# Patient Record
Sex: Female | Born: 1995 | Race: Black or African American | Hispanic: No | Marital: Single | Smoking: Never smoker
Health system: Southern US, Community
[De-identification: ages and names within clinical notes are randomized; demographics above are authoritative.]

---
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
## 2017-04-06 ENCOUNTER — Ambulatory Visit (INDEPENDENT_AMBULATORY_CARE_PROVIDER_SITE_OTHER): Payer: Medicaid Other | Admitting: Family Medicine

## 2017-04-06 ENCOUNTER — Encounter: Payer: Self-pay | Admitting: Family Medicine

## 2017-04-06 VITALS — BP 100/60 | HR 96 | Temp 98.6°F | Ht 60.0 in | Wt 106.4 lb

## 2017-04-06 DIAGNOSIS — Z0000 Encounter for general adult medical examination without abnormal findings: Secondary | ICD-10-CM

## 2017-04-06 NOTE — Patient Instructions (Signed)
It was very good to see you again today.  I'll see you back next year, or sooner if anything is going on.    Best of luck next year as well!  Health Maintenance, Female Adopting a healthy lifestyle and getting preventive care can go a long way to promote health and wellness. Talk with your health care provider about what schedule of regular examinations is right for you. This is a good chance for you to check in with your provider about disease prevention and staying healthy. In between checkups, there are plenty of things you can do on your own. Experts have done a lot of research about which lifestyle changes and preventive measures are most likely to keep you healthy. Ask your health care provider for more information. Weight and diet Eat a healthy diet  Be sure to include plenty of vegetables, fruits, low-fat dairy products, and lean protein.  Do not eat a lot of foods high in solid fats, added sugars, or salt.  Get regular exercise. This is one of the most important things you can do for your health. ? Most adults should exercise for at least 150 minutes each week. The exercise should increase your heart rate and make you sweat (moderate-intensity exercise). ? Most adults should also do strengthening exercises at least twice a week. This is in addition to the moderate-intensity exercise.  Maintain a healthy weight  Body mass index (BMI) is a measurement that can be used to identify possible weight problems. It estimates body fat based on height and weight. Your health care provider can help determine your BMI and help you achieve or maintain a healthy weight.  For females 30 years of age and older: ? A BMI below 18.5 is considered underweight. ? A BMI of 18.5 to 24.9 is normal. ? A BMI of 25 to 29.9 is considered overweight. ? A BMI of 30 and above is considered obese.  Watch levels of cholesterol and blood lipids  You should start having your blood tested for lipids and  cholesterol at 19 years of age, then have this test every 5 years.  You may need to have your cholesterol levels checked more often if: ? Your lipid or cholesterol levels are high. ? You are older than 19 years of age. ? You are at high risk for heart disease.  Cervical Cancer Your health care provider may recommend that you be screened regularly for cancer of the pelvic organs (ovaries, uterus, and vagina). This screening involves a pelvic examination, including checking for microscopic changes to the surface of your cervix (Pap test). You may be encouraged to have this screening done every 3 years, beginning at age 16.  For women ages 48-65, health care providers may recommend pelvic exams and Pap testing every 3 years, or they may recommend the Pap and pelvic exam, combined with testing for human papilloma virus (HPV), every 5 years. Some types of HPV increase your risk of cervical cancer. Testing for HPV may also be done on women of any age with unclear Pap test results.  Other health care providers may not recommend any screening for nonpregnant women who are considered low risk for pelvic cancer and who do not have symptoms. Ask your health care provider if a screening pelvic exam is right for you.  If you have had past treatment for cervical cancer or a condition that could lead to cancer, you need Pap tests and screening for cancer for at least 20 years after your  treatment. If Pap tests have been discontinued, your risk factors (such as having a new sexual partner) need to be reassessed to determine if screening should resume. Some women have medical problems that increase the chance of getting cervical cancer. In these cases, your health care provider may recommend more frequent screening and Pap tests.  Sexually transmitted infections (STIs)  You should be screened for sexually transmitted infections (STIs) including gonorrhea and chlamydia if: ? You are sexually active and are younger  than 19 years of age. ? You are older than 19 years of age and your health care provider tells you that you are at risk for this type of infection. ? Your sexual activity has changed since you were last screened and you are at an increased risk for chlamydia or gonorrhea. Ask your health care provider if you are at risk.  If you do not have HIV, but are at risk, it may be recommended that you take a prescription medicine daily to prevent HIV infection. This is called pre-exposure prophylaxis (PrEP). You are considered at risk if: ? You are sexually active and do not regularly use condoms or know the HIV status of your partner(s). ? You take drugs by injection. ? You are sexually active with a partner who has HIV.  Talk with your health care provider about whether you are at high risk of being infected with HIV. If you choose to begin PrEP, you should first be tested for HIV. You should then be tested every 3 months for as long as you are taking PrEP . Pregnancy  If you are premenopausal and you may become pregnant, ask your health care provider about preconception counseling.  If you may become pregnant, take 400 to 800 micrograms (mcg) of folic acid every day.  If you want to prevent pregnancy, talk to your health care provider about birth control (contraception).    Follow these instructions at home:  Schedule regular health, dental, and eye exams.  Stay current with your immunizations.  Do not use any tobacco products including cigarettes, chewing tobacco, or electronic cigarettes.  If you are pregnant, do not drink alcohol.  If you are breastfeeding, limit how much and how often you drink alcohol.  Limit alcohol intake to no more than 1 drink per day for nonpregnant women. One drink equals 12 ounces of beer, 5 ounces of wine, or 1 ounces of hard liquor.  Do not use street drugs.  Do not share needles.  Ask your health care provider for help if you need support or  information about quitting drugs.  Tell your health care provider if you often feel depressed.  Tell your health care provider if you have ever been abused or do not feel safe at home.

## 2017-04-06 NOTE — Progress Notes (Signed)
Subjective:    Brenda Gordon is a 21 y.o. female who presents to [facility] today for yearly physical exam:  CPE  Concerns:  None today, doing well without complaints. No recent illnesses.    Last physical last year Tetanus prior to college - do not have these records but will try to find Flu vaccine last year Eye exam:  n/a Dental exam every six months.   PMH reviewed. Patient is a nonsmoker.   No past medical history on file. No past surgical history on file.  Medications reviewed. Current Outpatient Prescriptions  Medication Sig Dispense Refill  . benzonatate (TESSALON) 100 MG capsule Take 1 capsule (100 mg total) by mouth 3 (three) times daily as needed for cough. 20 capsule 0  . oseltamivir (TAMIFLU) 75 MG capsule Take 1 capsule (75 mg total) by mouth 2 (two) times daily. 10 capsule 0   No current facility-administered medications for this visit.     Social: Smoking history:  denies Alcohol use:  denies Illicit drug use:  denies Relationship status:   single Education:  Just finished freshman year at [employer].  Did well.  Wants to do medical track -- interested in medical school  Family History:  Denies any pertinent family medical history.   Review of Systems  Constitutional: Negative for fever.  HENT: Negative for congestion, ear discharge, ear pain and hearing loss.   Eyes: Negative for blurred vision.  Respiratory: Negative for cough and wheezing.   Cardiovascular: Negative for chest pain, palpitations and leg swelling.  Gastrointestinal: Negative for nausea, vomiting and abdominal pain.  Genitourinary: Negative for dysuria, hematuria and flank pain.  Skin: Negative for rash.  Psychiatric/Behavioral: Negative for depression and suicidal ideas.   Exam: BP 100/60   Pulse 96   Temp 98.6 F (37 C) (Oral)   Ht 5' (1.524 m)   Wt 106 lb 6.4 oz (48.3 kg)   LMP 03/27/2017 (Exact Date)   SpO2 99%   BMI 20.78 kg/m  Gen:  Alert, cooperative patient who appears stated age in no  acute distress.  Vital signs reviewed. Head: [place]/AT.   Eyes:  EOMI, PERRL.   Ears:  External ears WNL, Bilateral TM's normal without retraction, redness or bulging. Nose:  Septum midline  Mouth:  MMM, tonsils non-erythematous, non-edematous.   Neck: No masses or thyromegaly or limitation in range of motion.  No cervical lymphadenopathy. Pulm:  Clear to auscultation bilaterally with good air movement.  No wheezes or rales noted.   Cardiac:  Regular rate and rhythm without murmur auscultated.  Good S1/S2. Abd:  Soft/nondistended/nontender.  Good bowel sounds throughout all four quadrants.  No masses noted.  Ext:  No clubbing/cyanosis/erythema.  No edema noted bilateral lower extremities.   Neuro:  Grossly normal, no gait abnormalities Psych:  Not depressed or anxious appearing.  Conversant and engaged  Impression/Plan: 1. Complete Physical Examination: anticipatory guidance provided.  No concerns today.  Patient doing very well.   FU in 1 year or sooner if needs anything.

## 2017-09-27 ENCOUNTER — Other Ambulatory Visit: Payer: Self-pay

## 2017-09-27 ENCOUNTER — Encounter: Payer: Self-pay | Admitting: Student

## 2017-09-27 ENCOUNTER — Ambulatory Visit (INDEPENDENT_AMBULATORY_CARE_PROVIDER_SITE_OTHER): Payer: Medicaid Other | Admitting: Student

## 2017-09-27 VITALS — BP 104/78 | HR 87 | Temp 98.6°F | Ht 60.0 in | Wt 105.0 lb

## 2017-09-27 DIAGNOSIS — R058 Other specified cough: Secondary | ICD-10-CM

## 2017-09-27 DIAGNOSIS — R05 Cough: Secondary | ICD-10-CM

## 2017-09-27 MED ORDER — AZITHROMYCIN 250 MG PO TABS: ORAL_TABLET | ORAL | 0 refills | Status: AC

## 2017-09-27 NOTE — Patient Instructions (Addendum)
It appears that you have a viral upper respiratory infection.  You could have influenza/flu.  Unfortunately, it is a kind of late to start medications. Your symptoms can last up to 2 weeks but should improve.   - Get plenty of rest and adequate hydration. - A tablespoonful of honey before bedtime is helpful for cough - Consume warm fluids (soup or tea) to provide relief for a stuffy nose and to loosen phlegm. - For nasal stuffiness, try saline nasal spray or a Neti Pot. Eating warm liquids such as chicken soup or tea may also help with nasal congestion. - For sore throat pain relief: suck on throat lozenges, hard candy or popsicles; gargle with warm salt water (1/4 tsp. salt per 8 oz. of water); and eat soft, bland foods. - Eat a well-balanced diet. If you cannot, ensure you are getting enough nutrients by taking a daily multivitamin. - Avoid dairy products, as they can thicken phlegm. - Avoid alcohol, as it impairs your body's immune system.  CONTACT YOUR DOCTOR IF YOU EXPERIENCE ANY OF THE FOLLOWING: - High fever, chest pain, shortness of breath or  not able to keep down food or fluids.  - Cough that gets worse while other cold symptoms improve - Flare up of any chronic lung problem, such as asthma - Your symptoms persist longer than 2 weeks

## 2017-09-27 NOTE — Progress Notes (Signed)
  Subjective:    Brenda Gordon is a 21 y.o. old female here for cough and fatigue  HPI  Patient reports having fatigue for a week.  She also have productive cough with greenish phlegm. She denies hemoptysis.  Reports exertional dyspnea for 2 weeks.  Denies chest pain.  Reports chills and mild headache.  Denies fever or photophobia.  Endorses myalgia.  She also reports intermittent abdominal pain.  Denies runny nose, congestion, sore throat,  diarrhea, dysuria, vaginal discharge or bleeding, leg swelling or pain.  She had a flu shot this year.  LMP 08/27/2017.  Not sexually active.  Not on birth control.  Denies recent travel.  Denies family history of blood clots. She is a Surveyor, minerals at [employer].  She says this is a stressful at school due to final exam.   Patient's last menstrual period was 08/27/2017 (approximate). Not sexually active  PMH/Problem List: does not have any active problems on file.   has no past medical history on file.  FH:  Family History  Problem Relation Age of Onset  . Cancer Neg Hx   . Heart disease Neg Hx   . Stroke Neg Hx     SH Social History   Tobacco Use  . Smoking status: Never Smoker  . Smokeless tobacco: Never Used  Substance Use Topics  . Alcohol use: Not on file  . Drug use: Not on file    Review of Systems Review of systems negative except for pertinent positives and negatives in history of present illness above.     Objective:     Vitals:   09/27/17 1427  BP: 104/78  Pulse: 87  Temp: 98.6 F (37 C)  TempSrc: Oral  SpO2: 97%  Weight: 105 lb (47.6 kg)  Height: 5' (1.524 m)   Body mass index is 20.51 kg/m.  Physical Exam GEN: appears well, no ditress EYES: PERRL, EOMI THROAT: MMM, no exudation or erythema NECK: Supple, no lymphadenopathy RESP:  No IWOB, CTAB CVS:  RRR, normal S1&S2, no murmurs GI: soft, NT with active BS MSK: No focal tenderness NEURO: Grossly intact PSYCH: normal affect    Assessment and Plan:  1. Productive  cough: Patient with influenza-like illness.  However, she has no runny nose or congestion.  She also have productive cough with greenish phlegm concerning for a typical pneumonia.  Lungs are clear to auscultation bilaterally with good aeration.  Overall, she does not seem to be in respiratory distress.  Given duration of her symptoms and greenish phlegm, will treat as a typical pneumonia with Z-Pak. PE risk score 0.  We also discussed about conservative managements including rest, adequate hydration and a tablespoonful of honey before bedtime.  - azithromycin (ZITHROMAX) 250 MG tablet; Take 2 tabs by mouth on day 1, then 1 tab by mouth daily  Dispense: 6 each; Refill: 0  Return if symptoms worsen or fail to improve.  Mercy Riding, MD 09/27/17 Pager: [phone]

## 2017-09-28 ENCOUNTER — Ambulatory Visit: Payer: Self-pay | Admitting: Student

## 2018-04-20 ENCOUNTER — Telehealth: Payer: Self-pay | Admitting: Family Medicine

## 2018-04-20 DIAGNOSIS — I491 Atrial premature depolarization: Secondary | ICD-10-CM | POA: Diagnosis not present

## 2018-04-20 NOTE — Telephone Encounter (Signed)
Pt called and to let us know she was seen at [facility], she said "I think they diagnosed me with some kind of seizure and they wanted me to follow up with my doctor" I told her we could schedule an appointment and she said she doesn't know how urgent it really is. She would call back if she thinks it becomes more serious.

## 2018-04-25 NOTE — Telephone Encounter (Signed)
Pt appt on 04/26/18. Anees Vanecek, Salome Spotted, CMA

## 2018-04-25 NOTE — Telephone Encounter (Signed)
Ok.  It would be good to have her follow up to make sure she is doing ok.

## 2018-04-26 ENCOUNTER — Ambulatory Visit (INDEPENDENT_AMBULATORY_CARE_PROVIDER_SITE_OTHER): Payer: Medicaid Other | Admitting: Family Medicine

## 2018-04-26 ENCOUNTER — Encounter: Payer: Self-pay | Admitting: Family Medicine

## 2018-04-26 ENCOUNTER — Other Ambulatory Visit: Payer: Self-pay

## 2018-04-26 VITALS — BP 100/58 | HR 64 | Temp 99.0°F | Ht 60.0 in | Wt 107.8 lb

## 2018-04-26 DIAGNOSIS — Z0000 Encounter for general adult medical examination without abnormal findings: Secondary | ICD-10-CM | POA: Diagnosis not present

## 2018-04-26 DIAGNOSIS — Z01419 Encounter for gynecological examination (general) (routine) without abnormal findings: Secondary | ICD-10-CM | POA: Diagnosis not present

## 2018-04-26 NOTE — Progress Notes (Addendum)
Brenda Gordon is a 22 y.o. female who presents to [facility] today for comprehensive physical examination:  CPE  Concerns:  No stated concerns.    - did have syncopal episode recently while at work.  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  She been standing for quite some time and became lightheaded and noted increasing heart rate and suddenly "passed out."  She awakened fever around her.  She was evaluated in the emergency department and diagnosed with a vasovagal episode.  There was question of tongue biting and some jerking movements.  However the patient denies any postictal symptoms.  She has exactly where she was when she came to is able to hold conversations for those around her.  She is able to stand up on her own and had no confusion.  She is never had any seizure disorder in the past  Last physical last year Tetanus up-to-date-due next year in 2020 Flu vaccine up-to-date Eye exam: Yearly -wears contacts Dental exam every six months.   PMH reviewed.  No past medical history on file. No past surgical history on file.  Medications reviewed. No current outpatient medications on file.   No current facility-administered medications for this visit.     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  She would like to take a "gap year" to travel next year.  Family History: No family history of cancer or epilepsy.  Review of Systems  Constitutional: Negative for fever.  HENT: Negative for congestion, ear discharge, ear pain and hearing loss.   Eyes: Negative for blurred vision.  Respiratory: Negative for cough and wheezing.   Cardiovascular: Negative for chest pain, palpitations and leg swelling.  Gastrointestinal: Negative for nausea, vomiting and abdominal pain.  Genitourinary:  Negative for dysuria, hematuria and flank pain.  Musculoskeletal: Negative for neck pain.  Skin: Negative for rash.  Neurological: Negative for dizziness and headaches.  Psychiatric/Behavioral: Negative for depression and suicidal ideas.   Exam: BP (!) 100/58   Pulse 64   Temp 99 F (37.2 C) (Oral)   Ht 5' (1.524 m)   Wt 107 lb 12.8 oz (48.9 kg)   LMP 04/06/2018 (Approximate)   SpO2 99%   BMI 21.05 kg/m  Gen:  Alert, cooperative patient who appears stated age in no acute distress.  Vital signs reviewed. Head: [place]/AT.   Eyes:  EOMI, PERRL.   Ears:  External ears WNL, Bilateral TM's normal without retraction, redness or bulging. Nose:  Septum midline  Mouth:  MMM, tonsils non-erythematous, non-edematous.   Neck: No masses or thyromegaly or limitation in range of motion.  No cervical lymphadenopathy. Pulm:  Clear to auscultation bilaterally with good air movement.  No wheezes or rales noted.   Cardiac:  Regular rate and rhythm without murmur auscultated.  Good S1/S2. Abd:  Soft/nondistended/nontender.  Good bowel sounds throughout all four quadrants.  No masses noted.  Ext:  No clubbing/cyanosis/erythema.  No edema noted bilateral lower extremities.   Neuro:  Grossly normal, no gait abnormalities.  Strength and sensation are 5 out of 5 bilateral upper and lower extremities.  No neuro deficits noted. Psych:  Not depressed or anxious appearing.  Conversant and engaged  Impression/Plan: 1. Complete Physical Examination: anticipatory guidance provided.   2.  Vaccines: She is currently up-to-date but will be due for Tdap next year  3.  Syncopal episode: -She had a prodrome and had been standing for some time.  I agree this seems to be a vasovagal incident. -Syncope can sometimes have jerking type movements that are not true seizures. -No history of seizures.  She had no postictal state.  She clearly remember the incident and had no confusion afterwards. -Follow-up if this recurs.

## 2018-04-26 NOTE — Patient Instructions (Signed)
It was good to see you again today.  Like we discussed, it sounds most likely that you passed out (vasovagal episode) and NOT a seizure.  Your tetanus isn't due until next year.  Have a good rest of the summer.  -----------------------------------------------------------  Health Maintenance, Female Adopting a healthy lifestyle and getting preventive care can go a long way to promote health and wellness. Talk with your health care provider about what schedule of regular examinations is right for you. This is a good chance for you to check in with your provider about disease prevention and staying healthy. In between checkups, there are plenty of things you can do on your own. Experts have done a lot of research about which lifestyle changes and preventive measures are most likely to keep you healthy. Ask your health care provider for more information. Weight and diet Eat a healthy diet  Be sure to include plenty of vegetables, fruits, low-fat dairy products, and lean protein.  Do not eat a lot of foods high in solid fats, added sugars, or salt.  Get regular exercise. This is one of the most important things you can do for your health. ? Most adults should exercise for at least 150 minutes each week. The exercise should increase your heart rate and make you sweat (moderate-intensity exercise). ? Most adults should also do strengthening exercises at least twice a week. This is in addition to the moderate-intensity exercise.  Maintain a healthy weight  Body mass index (BMI) is a measurement that can be used to identify possible weight problems. It estimates body fat based on height and weight. Your health care provider can help determine your BMI and help you achieve or maintain a healthy weight.  For females 72 years of age and older: ? A BMI below 18.5 is considered underweight. ? A BMI of 18.5 to 24.9 is normal. ? A BMI of 25 to 29.9 is considered overweight. ? A BMI of 30 and above is  considered obese.  Watch levels of cholesterol and blood lipids  You should start having your blood tested for lipids and cholesterol at 20 years of age, then have this test every 5 years.  You may need to have your cholesterol levels checked more often if: ? Your lipid or cholesterol levels are high. ? You are older than 20 years of age. ? You are at high risk for heart disease.

## 2018-04-27 ENCOUNTER — Encounter: Payer: Self-pay | Admitting: Family Medicine

## 2018-07-15 ENCOUNTER — Ambulatory Visit (INDEPENDENT_AMBULATORY_CARE_PROVIDER_SITE_OTHER): Payer: Medicaid Other

## 2018-07-15 DIAGNOSIS — Z23 Encounter for immunization: Secondary | ICD-10-CM | POA: Diagnosis not present

## 2018-07-15 NOTE — Progress Notes (Signed)
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 Injection given, LD. Pt tolerated injection well. [facility] updated and copy given to patient for her employer.

## 2019-04-26 ENCOUNTER — Telehealth (INDEPENDENT_AMBULATORY_CARE_PROVIDER_SITE_OTHER): Payer: BLUE CROSS/BLUE SHIELD | Admitting: Family Medicine

## 2019-04-26 ENCOUNTER — Other Ambulatory Visit: Payer: Medicaid Other

## 2019-04-26 ENCOUNTER — Other Ambulatory Visit: Payer: Self-pay

## 2019-04-26 ENCOUNTER — Telehealth: Payer: Self-pay | Admitting: *Deleted

## 2019-04-26 ENCOUNTER — Telehealth: Payer: Self-pay

## 2019-04-26 DIAGNOSIS — J029 Acute pharyngitis, unspecified: Secondary | ICD-10-CM | POA: Diagnosis not present

## 2019-04-26 DIAGNOSIS — Z20822 Contact with and (suspected) exposure to covid-19: Secondary | ICD-10-CM

## 2019-04-26 NOTE — Progress Notes (Signed)
[facility] Telemedicine Visit  Patient consented to have virtual visit. Method of visit: Video  Encounter participants: Patient: Brenda Gordon - located at home Provider: Lind Covert - located at office  Others (if applicable): no  Chief Complaint: Fatigue Sore Throat HPI: Started with fatigue 4-5 days ago.  Developed ST and muscle aches fever up to 102 last 2 days.    Taking tylenol which helps a little. No dysuria, rash, shortness of breath, cough No known exposures - No new sexual contacts On her menstrual periods know   ROS: per HPI  Pertinent PMHx: none  Exam:  Respiratory: normal Normal appearance and speech Throat - tonsils normal size with erythema and no exudate seen  Patient does not feel any swollen glands   .  Assessment/Plan:  Pharyngitis History and video exam most consistent with viral pharyngitis.  Less likely strep without exudate or adenopathy.  Patient desires covid testing. Will refer. Recommend tylenol and NSAIDs for symptom relief.   Contact us if worsening or new symptoms or prolonged symptoms     Time spent during visit with patient: 12 minutes

## 2019-04-26 NOTE — Addendum Note (Signed)
Addended by: Dimple Nanas on: 04/26/2019 02:36 PM   Modules accepted: Orders

## 2019-04-26 NOTE — Telephone Encounter (Signed)
Patient scheduled today at [facility] site at 3:15pm, informed patient of address and patient voice understanding.

## 2019-04-26 NOTE — Telephone Encounter (Signed)
This encounter was created in error - please disregard.

## 2019-04-26 NOTE — Telephone Encounter (Addendum)
Attempted to contact pt to schedule testing; left message on voicemail.   ----- Message from Lind Covert, MD sent at 04/26/2019  9:07 AM EDT ----- Regarding: Covid testing Patient needs covid 19 testing Please contact  Patient Name:  Brenda Gordon  Date of Birth:    04/24/1996 MRN:                 [id number]  Thanks  Azzie Roup MD

## 2019-04-26 NOTE — Telephone Encounter (Signed)
Attempted to call pt. To schedule for COVID testing.  Left vm to return call to [phone] to schedule appt.

## 2019-04-26 NOTE — Assessment & Plan Note (Signed)
History and video exam most consistent with viral pharyngitis.  Less likely strep without exudate or adenopathy.  Patient desires covid testing. Will refer. Recommend tylenol and NSAIDs for symptom relief.   Contact us if worsening or new symptoms or prolonged symptoms

## 2019-04-26 NOTE — Telephone Encounter (Signed)
-----   Message from Lind Covert, MD sent at 04/26/2019  9:07 AM EDT ----- Regarding: Covid testing Patient needs covid 19 testing Please contact  Patient Name:  Brenda Gordon  Date of Birth:    10/06/1996 MRN:                 [id number]  Thanks  Azzie Roup MD

## 2019-04-28 ENCOUNTER — Telehealth: Payer: Self-pay | Admitting: Family Medicine

## 2019-04-28 NOTE — Telephone Encounter (Signed)
Pt is calling because she stated that the doctor was supposed to refer her to PT. I didn't see any referrals. Also she would have to be referred to a [facility] location for this since she has the [place] and [place]  [place] [phone]

## 2019-04-30 LAB — NOVEL CORONAVIRUS, NAA: SARS-CoV-2, NAA: NOT DETECTED

## 2019-07-03 ENCOUNTER — Other Ambulatory Visit: Payer: Self-pay

## 2019-07-03 DIAGNOSIS — Z20822 Contact with and (suspected) exposure to covid-19: Secondary | ICD-10-CM

## 2019-07-04 LAB — NOVEL CORONAVIRUS, NAA: SARS-CoV-2, NAA: DETECTED — AB

## 2019-07-12 ENCOUNTER — Other Ambulatory Visit: Payer: Self-pay

## 2019-07-12 DIAGNOSIS — Z20822 Contact with and (suspected) exposure to covid-19: Secondary | ICD-10-CM

## 2019-07-14 LAB — NOVEL CORONAVIRUS, NAA: SARS-CoV-2, NAA: NOT DETECTED

## 2019-11-17 ENCOUNTER — Ambulatory Visit: Payer: Medicaid Other | Attending: Internal Medicine

## 2019-11-17 DIAGNOSIS — Z20822 Contact with and (suspected) exposure to covid-19: Secondary | ICD-10-CM

## 2019-11-18 LAB — NOVEL CORONAVIRUS, NAA: SARS-CoV-2, NAA: NOT DETECTED

## 2019-11-27 ENCOUNTER — Ambulatory Visit (HOSPITAL_COMMUNITY)
Admission: RE | Admit: 2019-11-27 | Discharge: 2019-11-27 | Disposition: A | Discharge status: Home / Self Care | Payer: Medicaid Other | Source: Ambulatory Visit | Attending: Family Medicine | Admitting: Family Medicine

## 2019-11-27 ENCOUNTER — Ambulatory Visit (INDEPENDENT_AMBULATORY_CARE_PROVIDER_SITE_OTHER): Payer: Medicaid Other | Admitting: Student in an Organized Health Care Education/Training Program

## 2019-11-27 ENCOUNTER — Other Ambulatory Visit: Payer: Self-pay

## 2019-11-27 VITALS — BP 98/65 | HR 80 | Wt 103.2 lb

## 2019-11-27 DIAGNOSIS — R55 Syncope and collapse: Secondary | ICD-10-CM

## 2019-11-27 LAB — POCT GLYCOSYLATED HEMOGLOBIN (HGB A1C): Hemoglobin A1C: 5 % (ref 4.0–5.6)

## 2019-11-27 NOTE — Patient Instructions (Signed)
It was a pleasure to see you today!  To summarize our discussion for this visit:  I am glad that you brought the symptoms to our attention today.  We are going to collect some blood work today to evaluate for common causes of syncope.  In addition to the blood work we are going to check your heart function with an EKG and orthostatic vital signs today.  Please return within a week so we can review all of these results and discuss treatment options going forward.  In the meantime, please ensure that you are drinking plenty of fluids and electrolytes as well as maintaining a good solid diet to help prevent any dehydration from contributing to your symptoms.  Some additional health maintenance measures we should update are: Health Maintenance Due  Topic Date Due  . DTAP VACCINES (1) 05/09/1998  . HIV Screening  03/09/2013  . DTaP/Tdap/Td (1 - Tdap) 03/09/2017  . PAP-Cervical Cytology Screening  03/10/2019  . PAP SMEAR-Modifier  03/10/2019  . TETANUS/TDAP  03/29/2019  . INFLUENZA VACCINE  06/03/2019  .    Please return to our clinic to see me in about a week.  Call the clinic at [phone] if your symptoms worsen or you have any concerns.   Thank you for allowing me to take part in your care,  Dr. Doristine Mango

## 2019-11-27 NOTE — Progress Notes (Signed)
Subjective:    Patient ID: Brenda Gordon, female    DOB: 1996-01-01, 24 y.o.   MRN: [id number]  CC: fainting/weight loss  HPI:  Otherwise healthy 24 year old female with no past medical diagnoses presents today for initial work-up of syncope and weight loss. Patient feels well today. Weight is 103 pounds which is down from 107 pounds a year and half ago but is not her lowest weight recorded in the past couple of years. Weight loss-patient endorses decreased appetite without postprandial issues or premature satiety. Syncope-patient has had syncopal episodes for several years but has noticed they are becoming more frequent in the last couple of months.  She does not notice any particular events that precipitate these episodes.  Her prodromal symptoms include lightheadedness, nausea, racing heart, visual field darkening with spots.  Endorses losing consciousness up to a couple of minutes and regains consciousness on the floor and takes a few seconds to regain her orientation but remembers what happened.  Denies any loss of bowel or bladder control or any shaking during these episodes.  She recovers quickly after the episodes.  Denies any over-the-counter medications for increased anxiety/panic during these episodes.  Not associated with exertion. Patient's menstrual cycles were regular every month since onset but for the past year and a half has had irregular frequency with bleeding 1-2 times per month and occasionally missing a month.  Described as heavy bleeding for 2 days followed by 2-3 normal bleeding days.  She is not sexually active. Patient works as a Presenter, broadcasting at a clinic and denies any changes in her work life or increased stresses in her life at this time.  Denies any changes in her diet or home life in conjunction with the onset of the symptoms. Family history includes brother with history of dextrocardia and multiple cardiac surgeries up until the age of 22 years old and she describes them as  living a normal life now.  Smoking status reviewed   ROS: pertinent noted in the HPI   I have personally reviewed pertinent past medical history, surgical, family, and social history as appropriate. Objective:  BP 98/65   Pulse 80   Wt 103 lb 3.2 oz (46.8 kg)   SpO2 98%   BMI 20.15 kg/m   Vitals and nursing note reviewed  General: NAD, pleasant, able to participate in exam HEENT: Thyroid without masses or tenderness to palpation Negative for cervical lymphadenopathy or axillary lymphadenopathy Cardiac: RRR, S1 S2 present. normal heart sounds, no murmurs. Respiratory: CTAB, normal effort, No wheezes, rales or rhonchi Abdomen: Scaphoid, negative for organomegaly, active bowel sounds diffusely, negative for adnexal masses, negative for tenderness diffusely in abdomen or suprapubic area.  Negative for rash. GU: Negative for CVA tenderness bilaterally Extremities: no edema or cyanosis. Skin: warm and dry, no rashes noted Neuro: alert, no obvious focal deficits Psych: Normal affect and mood  Assessment & Plan:   Syncope and collapse Multiple syncopal episodes with unclear cause at this time.  We will start with a broad differential and work-up. No personal history of cardiac disease but does have a brother with history of dextrocardia and multiple cardiac surgeries as an infant who is now healthy. -Obtain EKG to assess for WPW -Orthostatic vital signs -CBC, BMP, TSH, A1c -Provided with instructions to maintain good hydration and p.o. intake -Follow-up in 1 week to discuss results.  More thorough history  -Call patient to inform her of her lab results.  Would like to repeat her CBC and patient would  also like her vitamin D checked at her next visit.  Additionally, I would like to recheck her orthostatic vital signs and EKG as there were some abnormalities.  We have a visit already scheduled for next week to discuss further work-up.  Orders Placed This Encounter  Procedures  . CBC   . Basic Metabolic Panel  . TSH  . HgB A1c  . EKG 12-Lead  . EKG 12-Lead    Ordered by an unspecified provider     Doristine Mango, [facility] PGY-2

## 2019-11-27 NOTE — Assessment & Plan Note (Addendum)
Multiple syncopal episodes with unclear cause at this time.  We will start with a broad differential and work-up. No personal history of cardiac disease but does have a brother with history of dextrocardia and multiple cardiac surgeries as an infant who is now healthy. -Obtain EKG to assess for WPW -Orthostatic vital signs -CBC, BMP, TSH, A1c -Provided with instructions to maintain good hydration and p.o. intake -Follow-up in 1 week to discuss results.  More thorough history  -Call patient to inform her of her lab results.  Would like to repeat her CBC and patient would also like her vitamin D checked at her next visit.  Additionally, I would like to recheck her orthostatic vital signs and EKG as there were some abnormalities.  We have a visit already scheduled for next week to discuss further work-up.

## 2019-11-28 ENCOUNTER — Telehealth: Payer: Self-pay | Admitting: Student in an Organized Health Care Education/Training Program

## 2019-11-28 LAB — CBC
Hematocrit: 41.3 % (ref 34.0–46.6)
Hemoglobin: 13.7 g/dL (ref 11.1–15.9)
MCH: 29.8 pg (ref 26.6–33.0)
MCHC: 33.2 g/dL (ref 31.5–35.7)
MCV: 90 fL (ref 79–97)
Platelets: 276 10*3/uL (ref 150–450)
RBC: 4.6 x10E6/uL (ref 3.77–5.28)
RDW: 11.9 % (ref 11.7–15.4)
WBC: 3 10*3/uL — ABNORMAL LOW (ref 3.4–10.8)

## 2019-11-28 LAB — BASIC METABOLIC PANEL
BUN/Creatinine Ratio: 18 (ref 9–23)
BUN: 11 mg/dL (ref 6–20)
CO2: 21 mmol/L (ref 20–29)
Calcium: 9.4 mg/dL (ref 8.7–10.2)
Chloride: 102 mmol/L (ref 96–106)
Creatinine, Ser: 0.61 mg/dL (ref 0.57–1.00)
GFR calc Af Amer: 150 mL/min/{1.73_m2} (ref >59)
GFR calc non Af Amer: 130 mL/min/{1.73_m2} (ref >59)
Glucose: 74 mg/dL (ref 65–99)
Potassium: 4.7 mmol/L (ref 3.5–5.2)
Sodium: 137 mmol/L (ref 134–144)

## 2019-11-28 LAB — TSH: TSH: 0.908 u[IU]/mL (ref 0.450–4.500)

## 2019-11-28 NOTE — Telephone Encounter (Signed)
Informed patient of results and recommended follow up for repeat blood work and ECG.

## 2019-12-04 ENCOUNTER — Other Ambulatory Visit: Payer: Self-pay

## 2019-12-04 ENCOUNTER — Encounter: Payer: Self-pay | Admitting: Student in an Organized Health Care Education/Training Program

## 2019-12-04 ENCOUNTER — Ambulatory Visit (INDEPENDENT_AMBULATORY_CARE_PROVIDER_SITE_OTHER): Payer: Medicaid Other | Admitting: Student in an Organized Health Care Education/Training Program

## 2019-12-04 ENCOUNTER — Ambulatory Visit (HOSPITAL_COMMUNITY): Payer: Medicaid Other | Attending: Family Medicine

## 2019-12-04 ENCOUNTER — Encounter (HOSPITAL_COMMUNITY): Payer: Self-pay

## 2019-12-04 VITALS — BP 98/62 | HR 74 | Wt 104.0 lb

## 2019-12-04 DIAGNOSIS — R55 Syncope and collapse: Secondary | ICD-10-CM

## 2019-12-04 DIAGNOSIS — D72818 Other decreased white blood cell count: Secondary | ICD-10-CM | POA: Diagnosis not present

## 2019-12-04 NOTE — Progress Notes (Signed)
   Subjective:    Patient ID: Brenda Gordon, female    DOB: Nov 01, 1996, 24 y.o.   MRN: [id number]  CC: f/u syncopal episodes  HPI:  24 year old female presents for follow-up of her lab results and further work-up of syncope.  Patient denies any repeat episodes of syncope or presyncopal symptoms.  After our last visit patient was able to evaluate some habits that she has developed in the last few months while these symptoms have worsened to distinguish what may be exacerbating her symptoms.  She notices that since starting to study for the [employer] she has had increased stress with decreased sleep and decreased p.o. intake.  Since coming to this realization, patient has made sure that she is getting 3 full meals per day as well as snacking on fruits and nuts in between meals while she is at work.  She is getting at least 6 hours of sleep per night.  She feels overall well.  Denies any intentions to lose weight or any insecurities about her current weight or body habitus.  Denies any history of restricting/binging or purging habits.  I have personally reviewed pertinent past medical history, surgical, family, and social history as appropriate. Objective:  BP 98/62   Pulse 74   Wt 104 lb (47.2 kg)   SpO2 97%   BMI 20.31 kg/m   No data found.Vitals and nursing note reviewed  General: NAD, pleasant,thin,  able to participate in exam Cardiac: RRR, no murmur appreciated Respiratory: CTAB, normal WOB Extremities: no edema or cyanosis. Skin: warm and dry, no rashes noted Neuro: alert, no obvious focal deficits Psych: Normal affect and mood  EKG: normal EKG, normal sinus rhythm. Possible PACs  Assessment & Plan:  Syncope and collapse Repeated patient's labs including vitamins. Discussed with patient possibility of eating habits and sleeping habits contributing to her symptoms for which she agreed with. Offered to refer patient to nutrition clinic but she declined. Due to patient's family history of  cardiac malformations and her current symptoms offered to refer patient for an echo but she declined. Since patient is now asymptomatic since her last visit she would like no further work-up at this time. Asked patient to return if her symptoms do return or worsening and to go to the emergency department if she has any concerns in an emergent situation.  Leukopenia Patient has improved yet mild leukopenia but other cell lines are within normal limits. 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  Orders Placed This Encounter  Procedures  . CBC with Differential  . Vitamin D 1,25 dihydroxy  . Vitamin B12  . EKG 12-Lead    Doristine Mango, [facility] PGY-2

## 2019-12-04 NOTE — Patient Instructions (Signed)
It was a pleasure to see you today!  To summarize our discussion for this visit:  I'm glad to hear that you have not had any more syncopal episodes.  Your EKG and orthostatic vital signs look improved today.  We will repeat your CBC and include a couple of vitamin tests.  We discussed getting your heart evaluated with an echo but he declined at this time knowing that if your symptoms get worse you can always let us know and we can refer you at that time.  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  I know that is a very stressful time in your life.  Please make sure that you are taking care of your health by eating nutritious meals and snacking in between as well as drinking plenty of fluids to prevent further episodes of syncope.  Some additional health maintenance measures we should update are: Health Maintenance Due  Topic Date Due  . DTAP VACCINES (1) 05/09/1998  . HIV Screening  03/09/2013  . DTaP/Tdap/Td (1 - Tdap) 03/09/2017  . PAP-Cervical Cytology Screening  03/10/2019  . PAP SMEAR-Modifier  03/10/2019  . TETANUS/TDAP  03/29/2019  . INFLUENZA VACCINE  06/03/2019  .    Please return to our clinic to see your PCP to complete some of your health maintenance issues.  Call the clinic at [phone] if your symptoms worsen or you have any concerns.   Thank you for allowing me to take part in your care,  Dr. Doristine Mango   Well Child Nutrition, Young Adult This sheet provides general nutrition recommendations. Talk with a health care provider or a diet and nutrition specialist (dietitian) if you have any questions. Nutrition  The amount of food you need to eat every day depends on your age, sex, size, and activity level. To figure out your daily calorie needs, look for a calorie calculator online or talk with your health care provider. Balanced diet Eat a balanced diet. Try to include:  Fruits. Aim for 2 cups a day. Examples of 1 cup of fruit include  1 large banana, 1 small apple, 8 large strawberries, or 1 large orange. Eat a variety of whole fruits and 100% fruit juice. Choose fresh, canned, frozen, or dried forms. Choose canned fruit that has the lowest added sugar or no added sugar.  Vegetables. Aim for 2-3 cups a day. Examples of 1 cup of vegetables include 2 medium carrots, 1 large tomato, or 2 stalks of celery. Choose fresh, frozen, canned, and dried options. Eat vegetables of a variety of colors.  Low-fat dairy. Aim for 3 cups a day. Examples of 1 cup of dairy include 8 oz (230 mL) of milk, 8 oz (230 g) of yogurt, or 1 oz (44 g) of natural cheese. Choose fat-free or low-fat dairy products, including milk, yogurt, and cheese. If you are unable to tolerate dairy (lactose intolerant) or you choose not to consume dairy, you may include fortified soy beverages (soy milk).  Whole grains. Of the grain foods that you eat each day (such as pasta, rice, and tortillas), aim to include 6-8 "ounce-equivalents" of whole-grain options. Examples of 1 ounce-equivalent of whole grains include 1 cup of whole-wheat cereal,  cup of brown rice, or 1 slice of whole-wheat bread. Try to choose whole grains including brown rice, wild rice, quinoa, and oats.  Lean proteins. Aim for 5-6 "ounce-equivalents" a day. Eat a variety of protein foods, including lean meats, seafood, poultry, eggs, legumes (beans and  peas), nuts, seeds, and soy products. ? A cut of meat or fish that is the size of a deck of cards is about 3-4 ounce-equivalents. ? Foods that provide 1 ounce-equivalent of protein include 1 egg,  cup of nuts or seeds, or 1 tablespoon (16 g) of peanut butter. For more information and options for foods in a balanced diet, visit www.BuildDNA.es Tips for healthy snacking  A snack should not be the size of a full meal. Eat snacks that have 200 calories or less. Examples include: ?  whole-wheat pita with  cup hummus. ? 2 or 3 slices of deli [place]  wrapped around a cheese stick. ?  apple with 1 tablespoon of peanut butter. ? 10 baked chips with salsa.  Keep cut-up fruits and vegetables available at home and at school so they are easy to eat.  Pack healthy snacks the night before or when you pack your lunch.  Avoid pre-packaged foods. These tend to be higher in fat, sugar, and salt (sodium).  Get involved with shopping, or ask the primary food shopper in your household to get healthy snacks that you like.  Avoid chips, candy, cake, and soft drinks. Foods to avoid  Maceo Pro or heavily processed foods, such as toaster pastries and microwaveable dinners.  Drinks that contain a lot of sugar, such as sports drinks, sodas, and juice.  Foods that contain a lot of fat, sodium, or sugar. Food safety Prepare your food safely:  Wash your hands after handling raw meats.  Keep food preparation surfaces clean by washing them regularly with hot, soapy water.  Keep raw meats separate from foods that are ready-to-eat, such as fruits and vegetables.  Cook seafood, meat, poultry, and eggs to the recommended minimum safe internal temperature.  Store foods at safe temperatures. In general: ? Keep cold foods at 71F (4C) or colder. ? Keep your freezer at 49F (-18C or 18 degrees below 0C) or colder. ? Keep hot foods at 171F (60C) or warmer. ? Foods are no longer safe to eat when they have been at a temperature of 40-171F (4-60C) for more than 2 hours. Physical activity  Try to get 150 minutes of moderate-intensity physical activity each week. Examples include walking briskly or bicycling slower than 10 miles an hour (16 km an hour).  Do muscle-strengthening exercises on 2 or more days a week.  If you find it difficult to fit regular physical activity into your schedule, try: ? Taking the stairs instead of the elevator. ? Parking your car farther from the entrance or at the back of the parking lot. ? Biking or walking to work or  school.  If you need to lose weight, you may need to reduce your daily calorie intake and increase your daily amount of physical activity. Check with your health care provider before you start a new diet and exercise plan. General instructions  Do not skip meals, especially breakfast.  Water is the ideal beverage. Aim to drink six 8-oz glasses of water each day.  Avoid fad diets. These may affect your mood and growth.  If you choose to consume alcohol: ? Drink in moderation. This means two drinks a day for men and one drink a day for nonpregnant women. One drink equals 12 oz of beer, 5 oz of wine, or 1 oz of hard liquor.  You may drink coffee. It is recommended that you limit coffee intake to three to five 8-oz cups a day (up to 400 mg of caffeine).  If you are worried about your body image, talk with your parents, your health care provider, or another trusted adult like a coach or counselor. You may be at risk for developing an eating disorder. Eating disorders can lead to serious medical problems.  Food allergies may cause you to have a reaction (such as a rash, diarrhea, or vomiting) after eating or drinking. Talk with your health care provider if you have concerns about food allergies. Summary  Eat a balanced diet. Include fruits, vegetables, low-fat dairy, whole grains, and lean proteins.  Try to get 150 minutes of moderate-intensity physical activity each week, and do muscle-strengthening exercises on 2 or more days a week.  Choose healthy snacks that are 200 calories or less.  Drink plenty of water. Try to drink six 8-oz glasses a day. This information is not intended to replace advice given to you by your health care provider. Make sure you discuss any questions you have with your health care provider. Document Revised: 02/07/2019 Document Reviewed: 06/02/2017 Elsevier Patient Education  [place].

## 2019-12-05 DIAGNOSIS — H5213 Myopia, bilateral: Secondary | ICD-10-CM | POA: Diagnosis not present

## 2019-12-06 DIAGNOSIS — D72819 Decreased white blood cell count, unspecified: Secondary | ICD-10-CM | POA: Insufficient documentation

## 2019-12-06 NOTE — Assessment & Plan Note (Signed)
Patient has improved yet mild leukopenia but other cell lines are within normal limits. 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

## 2019-12-06 NOTE — Assessment & Plan Note (Signed)
Repeated patient's labs including vitamins. Discussed with patient possibility of eating habits and sleeping habits contributing to her symptoms for which she agreed with. Offered to refer patient to nutrition clinic but she declined. Due to patient's family history of cardiac malformations and her current symptoms offered to refer patient for an echo but she declined. Since patient is now asymptomatic since her last visit she would like no further work-up at this time. Asked patient to return if her symptoms do return or worsening and to go to the emergency department if she has any concerns in an emergent situation.

## 2019-12-08 LAB — CBC WITH DIFFERENTIAL/PLATELET
Basophils Absolute: 0 10*3/uL (ref 0.0–0.2)
Basos: 0 %
EOS (ABSOLUTE): 0.1 10*3/uL (ref 0.0–0.4)
Eos: 2 %
Hematocrit: 42.1 % (ref 34.0–46.6)
Hemoglobin: 13.8 g/dL (ref 11.1–15.9)
Immature Grans (Abs): 0 10*3/uL (ref 0.0–0.1)
Immature Granulocytes: 0 %
Lymphocytes Absolute: 1.5 10*3/uL (ref 0.7–3.1)
Lymphs: 43 %
MCH: 29.7 pg (ref 26.6–33.0)
MCHC: 32.8 g/dL (ref 31.5–35.7)
MCV: 91 fL (ref 79–97)
Monocytes Absolute: 0.4 10*3/uL (ref 0.1–0.9)
Monocytes: 12 %
Neutrophils Absolute: 1.4 10*3/uL (ref 1.4–7.0)
Neutrophils: 43 %
Platelets: 268 10*3/uL (ref 150–450)
RBC: 4.64 x10E6/uL (ref 3.77–5.28)
RDW: 12 % (ref 11.7–15.4)
WBC: 3.3 10*3/uL — ABNORMAL LOW (ref 3.4–10.8)

## 2019-12-08 LAB — VITAMIN D 1,25 DIHYDROXY
Vitamin D 1, 25 (OH)2 Total: 69 pg/mL — ABNORMAL HIGH
Vitamin D2 1, 25 (OH)2: <10 pg/mL
Vitamin D3 1, 25 (OH)2: 69 pg/mL

## 2019-12-08 LAB — VITAMIN B12: Vitamin B-12: 298 pg/mL (ref 232–1245)

## 2020-01-29 ENCOUNTER — Telehealth: Payer: Self-pay | Admitting: Family Medicine

## 2020-01-29 NOTE — Telephone Encounter (Signed)
Please contact her and ask her to make an appointment to see me both for a referral and to follow up on her recent syncope visit  I have openings on Wednesday AM  If she can not come on Wednesday she can make an appointment with another physicain.  If she would like to speak with me let me know  Thanks  [place]

## 2020-01-29 NOTE — Telephone Encounter (Signed)
Pt is calling and would like to have a referral to see a therapist. Please call to discuss. jw

## 2020-01-29 NOTE — Telephone Encounter (Signed)
Made appointment for patient per Dr. Erin Hearing for referral and syncope/near syncope episodes.  Ozella Almond, [place]

## 2020-01-31 ENCOUNTER — Other Ambulatory Visit: Payer: Self-pay

## 2020-01-31 ENCOUNTER — Ambulatory Visit: Payer: Medicaid Other | Admitting: Family Medicine

## 2020-01-31 ENCOUNTER — Encounter: Payer: Self-pay | Admitting: Family Medicine

## 2020-01-31 DIAGNOSIS — F39 Unspecified mood [affective] disorder: Secondary | ICD-10-CM | POA: Diagnosis not present

## 2020-01-31 NOTE — Patient Instructions (Addendum)
Good to see you today!  Thanks for coming in.  Consider regular exercise as a way to keep your body and mind healthy  If you are feeling worse or having trouble setting up a therapist please call or My Chart me   Please send me a My Chart Message in 2 weeks letting me know how you are    Therapy and Counseling Resources Most providers on this list will take Medicaid. [facility]  [address] [place], [place] [address]      [phone]  [place] [address] [place], [place] [address]       [place] [address], [place], [place]    [facility] Bl[facility] [address], [place], [place]  [facility]:  [place]. [place]ride  [facility]:  [place] [address]oni Muse, [place]  [facility] (under & uninsured) [address], [place] [phone]    [email or web address] [place]     Phone:  [phone]     [place]  [place] [address]      [place], [address] [place]: [place], [place], [place]   [place] ([place] therapist) [address] [place] [address]   [place] [address]    [phone]    [facility]   [place],  [place], [place]   [place]  [place]  [facility]  [address] [place], [place]        [phone]  [facility]Walk In Clinic under & uninsured [place], To schedule an appointment call [phone]- [phone] [address], [place] [phone]):  [place] - Fri from 8 AM - 3 PM  [facility] [place],   ([place])   [place], [place]: [phone]) 8:30 - 12; 1 - 2:30  [facility] [place],  [place], [place]    [phone]):8:30 - 12; 2 - 3PM  [facility],  [address],  [place]; [phone]):   Mon - Fri 8 AM - 5 PM  Specific Provider options Psychology Today  [email or web address] 1. click on find a therapist  2. enter your zip code 3. left side and select or tailor a therapist for your specific need.   [facility] Provider Directory [email or web address]  (Medicaid)   Follow all drop down to find a provider  [place] [phone] or [email or web address], [place], [place] Recovery support and educational   24- Hour Availability:  . [place] or [phone]  . [facility] [employer] [phone]

## 2020-01-31 NOTE — Progress Notes (Signed)
    SUBJECTIVE:   CHIEF COMPLAINT / HPI:   ANXIETY DEPRESSION Has been having trouble concentrating with frequent worrying thoughts.  Downplays any depressive feelings but indicates they are very significant on PHQ 9.  No suicidal ideation.   Feels these are interfering with her ability to study for the MCAT and with her relations with her family.   No prior treatment for mood disorder Brother has bipolar No substance use Is not exercising but finds it centering   SYNCOPE Seen 2/1 for syncope episode.  None since.  Mild lightheadness when stands quickly. No chest pain   Her lab work up was unremarkable. She did not want further work up   [place] / [place]: Graduated college in December 2020  OBJECTIVE:   BP (!) 88/62   Pulse 76   Ht 5' (1.524 m)   Wt 103 lb 9.6 oz (47 kg)   SpO2 98%   BMI 20.23 kg/m   Psych:  Cognition and judgment appear intact. Alert, communicative  and cooperative with normal attention span and concentration. No apparent delusions, illusions, hallucinations   ASSESSMENT/PLAN:   Mood disorder ([place]) Her interview suggests more anxiety while PHQ 9 indicates depression.  When we discussed the discrepancy she felt was more anxiety. She does not wish to start medication now See after visit summary. She will stay in touch with My Chart    I spent 35 minutes reviewing her chart and interviewing and counseling and documenting   Lind Covert, [place]

## 2020-01-31 NOTE — Assessment & Plan Note (Signed)
Her interview suggests more anxiety while PHQ 9 indicates depression.  When we discussed the discrepancy she felt was more anxiety. She does not wish to start medication now See after visit summary. She will stay in touch with My Chart

## 2020-02-16 ENCOUNTER — Encounter: Payer: Self-pay | Admitting: Family Medicine

## 2020-06-21 ENCOUNTER — Encounter: Payer: Self-pay | Admitting: Family Medicine

## 2020-10-18 ENCOUNTER — Ambulatory Visit: Payer: Medicaid Other

## 2020-11-08 DIAGNOSIS — Z20822 Contact with and (suspected) exposure to covid-19: Secondary | ICD-10-CM | POA: Diagnosis not present

## 2021-01-14 ENCOUNTER — Encounter: Payer: Medicaid Other | Admitting: Family Medicine

## 2021-01-30 ENCOUNTER — Ambulatory Visit: Payer: Medicaid Other | Attending: Internal Medicine

## 2021-01-30 DIAGNOSIS — Z23 Encounter for immunization: Secondary | ICD-10-CM

## 2021-01-30 NOTE — Progress Notes (Signed)
   Covid-19 Vaccination Clinic  Name:  Brenda Gordon    MRN: [id number] DOB: 03/02/1996  01/30/2021  [name] was observed post Covid-19 immunization for 15 minutes without incident. She was provided with Vaccine Information Sheet and instruction to access the V-Safe system.   [name] was instructed to call 911 with any severe reactions post vaccine: Marland Kitchen Difficulty breathing  . Swelling of face and throat  . A fast heartbeat  . A bad rash all over body  . Dizziness and weakness   Immunizations Administered    Name Date Dose VIS Date Route   PFIZER Comrnaty(Gray TOP) Covid-19 Vaccine 01/30/2021  3:37 PM 0.3 mL 10/10/2020 Intramuscular   Manufacturer: [employer]   Lot: [id number]   [place]: [phone]

## 2021-02-06 ENCOUNTER — Other Ambulatory Visit (HOSPITAL_BASED_OUTPATIENT_CLINIC_OR_DEPARTMENT_OTHER): Payer: Self-pay

## 2021-02-10 ENCOUNTER — Other Ambulatory Visit (HOSPITAL_BASED_OUTPATIENT_CLINIC_OR_DEPARTMENT_OTHER): Payer: Self-pay

## 2021-02-10 MED ORDER — PFIZER-BIONT COVID-19 VAC-TRIS 30 MCG/0.3ML IM SUSP
INTRAMUSCULAR | 0 refills | Status: DC
  Filled 2021-02-10: qty 0.3, 1d supply, fill #0

## 2021-02-18 ENCOUNTER — Encounter: Payer: Medicaid Other | Admitting: Family Medicine

## 2021-03-12 ENCOUNTER — Ambulatory Visit (INDEPENDENT_AMBULATORY_CARE_PROVIDER_SITE_OTHER): Payer: Medicaid Other | Admitting: Family Medicine

## 2021-03-12 ENCOUNTER — Other Ambulatory Visit: Payer: Self-pay

## 2021-03-12 ENCOUNTER — Encounter: Payer: Self-pay | Admitting: Family Medicine

## 2021-03-12 DIAGNOSIS — F39 Unspecified mood [affective] disorder: Secondary | ICD-10-CM | POA: Diagnosis not present

## 2021-03-12 DIAGNOSIS — R5383 Other fatigue: Secondary | ICD-10-CM | POA: Diagnosis not present

## 2021-03-12 DIAGNOSIS — G4452 New daily persistent headache (NDPH): Secondary | ICD-10-CM | POA: Diagnosis not present

## 2021-03-12 NOTE — Progress Notes (Signed)
    SUBJECTIVE:   CHIEF COMPLAINT / HPI:   HEADACHE Headache started in March.  They come and go but are almost daily now.  Almost all are L sided.  No prodrome or nausea and vomiting or photophobia or weakness or visual changes.  Improve with tylenol  Head trauma: no Sudden onset: no Previous similar headaches: no Taking blood thinners: no History of cancer: no  Symptoms Nose congestion stuffiness:  no Nausea vomiting: no Photophobia: no Noise sensitivity: no Double vision or loss of vision: no Fever: no Neck Stiffness: no Trouble walking or speaking: no  Has not seen her eye doctor for a while   FATIGUE Mood Overall feels better than last visit.  Does feel very tired most of the time and does have trouble sleeping.  No weight loss or fever.   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  Never been sexually active  OBJECTIVE:   BP 111/73   Pulse 75   Wt 107 lb 9.6 oz (48.8 kg)   LMP 03/02/2021   SpO2 100%   BMI 21.01 kg/m   Neurologic exam : Cn 2-7 intact Strength equal & normal in upper & lower extremities Able to walk on heels and toes.   Balance normal  Romberg normal, finger to nose normal Eye - Pupils Equal Round Reactive to light, Extraocular movements intact, Fundi without hemorrhage or visible lesions, Conjunctiva without redness or discharge The exam light did start to bring on a headache  No temporal tenderness Neck:  No deformities, thyromegaly, masses, or tenderness noted.   Supple with full range of motion without pain.    ASSESSMENT/PLAN:   Fatigue Likely related to stress but will check labs   Mood disorder (HCC) No depressive but does have fatigue and sleep issues and feels she is stressed due to pending medical school applications.  Consider counseling and regular  exercise   Headache Seems consistent with tension headache except for unilaterality which suggest migraine.  No other migraine type symptoms.  No signs of intracranial lesion of tumor or cva or encephalitis.   Keep headache diary and address stress with close follow up      Lind Covert, [place]

## 2021-03-12 NOTE — Patient Instructions (Signed)
Good to see you today - Thank you for coming in  Things we discussed today:  Headache - Keep a diary of when starts, any other symptoms, when stops and if you did anything  See your eye doctor   I will call you if your tests are not good.  Otherwise, I will send you a message on MyChart (if it is active) or a letter in the mail..  If you do not hear from me with in 2 weeks please call our office.     Consider stress reduction - consider worry time and counselor   Come back to see me in 3 weeks

## 2021-03-13 DIAGNOSIS — R519 Headache, unspecified: Secondary | ICD-10-CM | POA: Insufficient documentation

## 2021-03-13 LAB — CMP14+EGFR
ALT: 15 IU/L (ref 0–32)
AST: 18 IU/L (ref 0–40)
Albumin/Globulin Ratio: 1.4 (ref 1.2–2.2)
Albumin: 4.4 g/dL (ref 3.9–5.0)
Alkaline Phosphatase: 67 IU/L (ref 44–121)
BUN/Creatinine Ratio: 23 (ref 9–23)
BUN: 15 mg/dL (ref 6–20)
Bilirubin Total: 0.3 mg/dL (ref 0.0–1.2)
CO2: 19 mmol/L — ABNORMAL LOW (ref 20–29)
Calcium: 9.2 mg/dL (ref 8.7–10.2)
Chloride: 101 mmol/L (ref 96–106)
Creatinine, Ser: 0.66 mg/dL (ref 0.57–1.00)
Globulin, Total: 3.1 g/dL (ref 1.5–4.5)
Glucose: 77 mg/dL (ref 65–99)
Potassium: 4.2 mmol/L (ref 3.5–5.2)
Sodium: 139 mmol/L (ref 134–144)
Total Protein: 7.5 g/dL (ref 6.0–8.5)
eGFR: 126 mL/min/{1.73_m2} (ref >59)

## 2021-03-13 LAB — CBC
Hematocrit: 43.1 % (ref 34.0–46.6)
Hemoglobin: 14.4 g/dL (ref 11.1–15.9)
MCH: 30.2 pg (ref 26.6–33.0)
MCHC: 33.4 g/dL (ref 31.5–35.7)
MCV: 90 fL (ref 79–97)
Platelets: 267 10*3/uL (ref 150–450)
RBC: 4.77 x10E6/uL (ref 3.77–5.28)
RDW: 12 % (ref 11.7–15.4)
WBC: 2.9 10*3/uL — ABNORMAL LOW (ref 3.4–10.8)

## 2021-03-13 NOTE — Assessment & Plan Note (Signed)
Seems consistent with tension headache except for unilaterality which suggest migraine.  No other migraine type symptoms.  No signs of intracranial lesion of tumor or cva or encephalitis.   Keep headache diary and address stress with close follow up

## 2021-03-13 NOTE — Assessment & Plan Note (Signed)
Likely related to stress but will check labs

## 2021-03-13 NOTE — Assessment & Plan Note (Signed)
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  Consider counseling and regular exercise

## 2021-03-24 ENCOUNTER — Ambulatory Visit (INDEPENDENT_AMBULATORY_CARE_PROVIDER_SITE_OTHER): Payer: Medicaid Other | Admitting: Family Medicine

## 2021-03-24 ENCOUNTER — Other Ambulatory Visit: Payer: Self-pay

## 2021-03-24 VITALS — BP 110/62 | HR 69

## 2021-03-24 DIAGNOSIS — T7840XS Allergy, unspecified, sequela: Secondary | ICD-10-CM | POA: Diagnosis not present

## 2021-03-24 DIAGNOSIS — B349 Viral infection, unspecified: Secondary | ICD-10-CM | POA: Insufficient documentation

## 2021-03-24 DIAGNOSIS — M791 Myalgia, unspecified site: Secondary | ICD-10-CM | POA: Diagnosis not present

## 2021-03-24 MED ORDER — CETIRIZINE HCL 10 MG PO TABS: 10.0000 mg | ORAL_TABLET | Freq: Every day | ORAL | 11 refills | Status: DC

## 2021-03-24 NOTE — Patient Instructions (Addendum)
Try zyrtec daily.  Drink plenty of water.  Can try tea with honey, cough drops, OTC cough medications as needed.  Warm showers can help as well.  F.u if not improving by the end of this week or sooner if any difficulty breathing, severe pain, blurred vision, weakness/numbness, or problems with speech.

## 2021-03-24 NOTE — Progress Notes (Signed)
    SUBJECTIVE:   CHIEF COMPLAINT / HPI: "fatigue/foggy"  Brenda Gordon is a 25 year old female presenting for evaluation of increased fatigue, myalgias, foggy headed, and headache.  Symptoms initially started on Saturday, she had just started her menstrual cycle so attributed the fatigue to that.  However she then continued to develop myalgias and feeling foggy headed with some congestion and scratchy throat (not bothering her much), prompting her that may be something else was going on.  Headache was like her usual for the past 2-3 months, using Tylenol and ibuprofen with some relief but not as much as usual.  No known sick contacts, had her [place] booster in 12/2020.  Stayed home from work today.  Denies any associated fever, difficulty breathing, rash, cough, vomiting, diarrhea, blurred vision, weakness, or numbness.  She is curious if maybe this is being caused by allergies as she had a similar scenario about 2 months ago.  The most bothersome to her right now is the fatigue.  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  PERTINENT  PMH / PSH: Headaches, fatigue  OBJECTIVE:   BP 110/62   Pulse 69   LMP 03/02/2021   SpO2 99%   General: Alert, NAD, well-appearing HEENT: NCAT, MMM, oropharynx nonerythematous, bilateral TMs clear with appropriate light reflex, normal external canals bilaterally.  EOMI, PERRLA. Cardiac: RRR no m/g/r Lungs: Clear bilaterally, no increased WOB, no wheezing or decreased breath sounds Abdomen: soft, non-tender Msk: Moves all extremities spontaneously, normal gait  Neuro: Alert and oriented.  CN II-XII intact.  Able to move all extremities spontaneously and equally.  Follows normal conversation, can follow complex commands.  Sensation to light touch intact. Ext: Warm, dry, 2+ distal pulses  ASSESSMENT/PLAN:   Viral illness Overall syndrome with increased fatigue/mental fogginess/myalgias/sore throat most consistent with viral illness.  She is  well-appearing and neurologically intact on exam.  Swabbed for COVID today as well, however fortunately just had her booster 2 months ago.  Will follow-up COVID results.  Encouraged adequate hydration, Tylenol/ibuprofen, tea with honey, OTC medications as needed.  Rx'd Zyrtec in case additional allergic component, however would not expect this to cause fatigue/myalgias.    ED precautions discussed, follow-up if not improving by the end of the week.  Work note provided.  Patriciaann Clan, [place]

## 2021-03-24 NOTE — Assessment & Plan Note (Signed)
Overall syndrome with increased fatigue/mental fogginess/myalgias/sore throat most consistent with viral illness.  She is well-appearing and neurologically intact on exam.  Swabbed for COVID today as well, however fortunately just had her booster 2 months ago.  Will follow-up COVID results.  Encouraged adequate hydration, Tylenol/ibuprofen, tea with honey, OTC medications as needed.  Rx'd Zyrtec in case additional allergic component, however would not expect this to cause fatigue/myalgias.

## 2021-03-25 LAB — NOVEL CORONAVIRUS, NAA: SARS-CoV-2, NAA: NOT DETECTED

## 2021-03-25 LAB — SARS-COV-2, NAA 2 DAY TAT

## 2021-12-17 ENCOUNTER — Ambulatory Visit: Payer: Medicaid Other | Admitting: Family Medicine

## 2021-12-17 ENCOUNTER — Encounter: Payer: Self-pay | Admitting: Family Medicine

## 2021-12-17 ENCOUNTER — Other Ambulatory Visit: Payer: Self-pay

## 2021-12-17 VITALS — BP 99/60 | HR 71 | Wt 115.0 lb

## 2021-12-17 DIAGNOSIS — Z114 Encounter for screening for human immunodeficiency virus [HIV]: Secondary | ICD-10-CM | POA: Diagnosis not present

## 2021-12-17 DIAGNOSIS — N9489 Other specified conditions associated with female genital organs and menstrual cycle: Secondary | ICD-10-CM

## 2021-12-17 DIAGNOSIS — Z1159 Encounter for screening for other viral diseases: Secondary | ICD-10-CM

## 2021-12-17 MED ORDER — NORGESTIMATE-ETH ESTRADIOL 0.25-35 MG-MCG PO TABS: 1.0000 | ORAL_TABLET | Freq: Every day | ORAL | 1 refills | Status: DC

## 2021-12-17 NOTE — Patient Instructions (Addendum)
Good to see you today - Thank you for coming in  Things we discussed today:  Menstrual periods Start the bcps the first day of your next menstrual period and then take continuously skipping the placebo pills  Let me know about headache or leg swelling and stay active on the flight and consider support stockings  Let me know with any questions or problems

## 2021-12-17 NOTE — Progress Notes (Signed)
° ° °  SUBJECTIVE:   CHIEF COMPLAINT / HPI:   Period Regulation Wishes to pilgrimage to [place] and would like not to have menstrual period while there. She has researched and would like to start oral bcp.  No history of blood clots or migraine headaches   PERTINENT  PMH / PSH: working for [employer] care.  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  She will let me know if any problems      Lind Covert, [place]

## 2021-12-17 NOTE — Assessment & Plan Note (Signed)
Decided to start BCP with next menstrual periods and continue without placebo until back from pilgrimage.  She will let me know if any problems

## 2022-01-09 ENCOUNTER — Telehealth: Payer: Self-pay | Admitting: Family Medicine

## 2022-01-09 NOTE — Telephone Encounter (Signed)
Created in error

## 2022-01-09 NOTE — Telephone Encounter (Deleted)
-----   Message from Lind Covert, MD sent at 12/17/2021  3:06 PM EST ----- ?Regarding: CASE - MP suppression ? ? ?

## 2022-02-04 ENCOUNTER — Ambulatory Visit: Payer: Medicaid Other | Admitting: Family Medicine

## 2022-04-07 ENCOUNTER — Encounter: Payer: Self-pay | Admitting: *Deleted

## 2022-05-29 ENCOUNTER — Ambulatory Visit: Payer: Medicaid Other | Admitting: Family Medicine

## 2022-05-29 ENCOUNTER — Ambulatory Visit: Payer: Medicaid Other

## 2022-05-29 VITALS — BP 93/67 | HR 65 | Ht 60.0 in | Wt 111.6 lb

## 2022-05-29 DIAGNOSIS — R200 Anesthesia of skin: Secondary | ICD-10-CM

## 2022-05-29 DIAGNOSIS — L84 Corns and callosities: Secondary | ICD-10-CM | POA: Diagnosis not present

## 2022-05-29 NOTE — Progress Notes (Signed)
    SUBJECTIVE:   CHIEF COMPLAINT / HPI:  Chief Complaint  Patient presents with   Toe Pain   Numbness    Patient presents with right great toe numbness that she first noticed about 4 days ago.  Numbness is only on the lateral aspect of the toe.  She recently returned from a trip to [place] and was doing a significant amount of walking.  PERTINENT  PMH / PSH: Noncontributory  Patient Care Team: Lind Covert, MD as PCP - General (Family Medicine)   OBJECTIVE:   BP 93/67   Pulse 65   Ht 5' (1.524 m)   Wt 111 lb 9.6 oz (50.6 kg)   LMP 05/20/2022   SpO2 100%   BMI 21.80 kg/m   Physical Exam Constitutional:      General: She is not in acute distress. Cardiovascular:     Rate and Rhythm: Normal rate.  Pulmonary:     Effort: Pulmonary effort is normal. No respiratory distress.  Musculoskeletal:     Cervical back: Neck supple.  Skin:    Comments: Callus noted on the medial aspect of the right great toe.  There is minor inward curving of the toenail of the great toes without obvious erythema or overt ingrown toenail.  Neurological:     Mental Status: She is alert.     Comments: Sensation is decreased with monofilament testing at the location of the callus.         05/29/2022    3:41 PM  Depression screen PHQ 2/9  Decreased Interest 0  Down, Depressed, Hopeless 0  PHQ - 2 Score 0  Altered sleeping 0  Tired, decreased energy 0  Change in appetite 0  Feeling bad or failure about yourself  0  Trouble concentrating 0  Moving slowly or fidgety/restless 0  Suicidal thoughts 0  PHQ-9 Score 0  Difficult doing work/chores Not difficult at all     {Show previous vital signs (optional):23777}    ASSESSMENT/PLAN:   Numbness of right great toe I think this is most likely related to a callus on her foot related to significant amount of walking she has done recently which I think will slowly improve on its own.  Inward curving of the toenails could possibly be  contributing as well.  Reasonable to check lab work. - labs: vitamin B12, TSH, A1c - epsom salt baths  Return if symptoms worsen or fail to improve.   Zola Button, MD [place]

## 2022-05-29 NOTE — Patient Instructions (Addendum)
It was nice seeing you today!  Blood work today.  You can try epsom salt baths.  If getting worse or not going away, you can schedule an appointment with me for toenail removal.  Stay well, Zola Button, MD [place] [phone]  --  Make sure to check out at the front desk before you leave today.  Please arrive at least 15 minutes prior to your scheduled appointments.  If you had blood work today, I will send you a MyChart message or a letter if results are normal. Otherwise, I will give you a call.  If you had a referral placed, they will call you to set up an appointment. Please give [place] a call if you don't hear back in the next 2 weeks.  If you need additional refills before your next appointment, please call your pharmacy first.

## 2022-05-29 NOTE — Progress Notes (Deleted)
  SUBJECTIVE:   CHIEF COMPLAINT / HPI:   Right Foot/Toe Pain:  Onset: *** Trauma to area: *** Discoloration: *** Sensation: *** Quality: ***   PERTINENT  PMH / PSH: None   OBJECTIVE:  There were no vitals taken for this visit.  General: NAD, pleasant, able to participate in exam Cardiac: RRR, no murmurs auscultated Respiratory: CTAB, normal WOB Abdomen: soft, non-tender, non-distended, normoactive bowel sounds Extremities: warm and well perfused, no edema or cyanosis  Right Foot:  Skin: warm and dry, no rashes noted Neuro: alert, no obvious focal deficits, speech normal Psych: Normal affect and mood  ASSESSMENT/PLAN:  No problem-specific Assessment & Plan notes found for this encounter.   No orders of the defined types were placed in this encounter.  No orders of the defined types were placed in this encounter.   - Tylenol and Ibuprofen alternating for pain - Voltaren gel as needed  - Return if symptoms persists after conservative treatment in 1-2 months    No follow-ups on file. Erskine Emery, MD 05/29/2022, 7:39 AM PGY-2, [place] {    This will disappear when note is signed, click to select method of visit    :1}

## 2022-05-30 LAB — TSH RFX ON ABNORMAL TO FREE T4: TSH: 0.823 u[IU]/mL (ref 0.450–4.500)

## 2022-05-30 LAB — VITAMIN B12: Vitamin B-12: 576 pg/mL (ref 232–1245)

## 2022-05-30 LAB — HEMOGLOBIN A1C
Est. average glucose Bld gHb Est-mCnc: 103 mg/dL
Hgb A1c MFr Bld: 5.2 % (ref 4.8–5.6)

## 2022-07-13 NOTE — Progress Notes (Deleted)
    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  ***  ASSESSMENT/PLAN:   No problem-specific Assessment & Plan notes found for this encounter.     Lind Covert, MD [place]

## 2022-07-14 ENCOUNTER — Ambulatory Visit: Payer: Medicaid Other | Admitting: Family Medicine

## 2022-08-12 ENCOUNTER — Telehealth: Payer: Self-pay | Admitting: *Deleted

## 2022-08-12 NOTE — Telephone Encounter (Signed)
Patient states that she has an irregular place on her hand that just came up about a month ago.  There is no irritation of discomfort but would like to get it checked out. She is scheduled for skin clinic on 08/19/22.  Denys Labree,CMA

## 2022-08-19 ENCOUNTER — Ambulatory Visit: Payer: Medicaid Other | Admitting: Student

## 2022-08-19 VITALS — BP 95/65 | HR 67 | Temp 98.3°F | Ht 60.0 in | Wt 118.6 lb

## 2022-08-19 DIAGNOSIS — D229 Melanocytic nevi, unspecified: Secondary | ICD-10-CM | POA: Diagnosis not present

## 2022-08-19 NOTE — Patient Instructions (Signed)
It was great to see you! Thank you for allowing me to participate in your care!  You have a nevus or mole on your thumb. This is non-concerning and shouldn't change or bother you.  *If mole starts to change/bother you/grow/change colors or shape, please come back in to be seen, as this may be a sign of skin cancer.    Take care and seek immediate care sooner if you develop any concerns.   Dr. Holley Bouche, MD [place]

## 2022-08-19 NOTE — Progress Notes (Signed)
  SUBJECTIVE:   CHIEF COMPLAINT / HPI:   Spot on right hand that has been there for 3 months. It just showed up one day, she doesn't think it's gotten bigger. And never bothers her or itches. Only on right thumb.   PERTINENT  PMH / PSH:     OBJECTIVE:  BP 95/65   Pulse 67   Temp 98.3 F (36.8 C)   Ht 5' (1.524 m)   Wt 118 lb 9.6 oz (53.8 kg)   LMP 08/05/2022 (Approximate)   SpO2 100%   BMI 23.16 kg/m  Skin: Small pinpoint well demarcated hyperpigmented macule with regular borders  ASSESSMENT/PLAN:  Nevus  Patient comes in with concern for lesion on thumb that's been there for 3 months. Lesion is hyperpigmented pinpoint macule with symmetry, regular boarder, consistent color, and a non-concerning size without further development or evolution. This lesion is likely a nevus, but also considered melanoma. Given characteristics of lesion, low concern for melanoma. Gave patient return instructions if lesion begins to evolve.       No follow-ups on file. Holley Bouche, MD 08/19/2022, 9:34 AM PGY-2, [place]

## 2022-12-24 ENCOUNTER — Encounter: Payer: Self-pay | Admitting: Family Medicine

## 2022-12-24 MED ORDER — NORGESTIMATE-ETH ESTRADIOL 0.25-35 MG-MCG PO TABS: 1.0000 | ORAL_TABLET | Freq: Every day | ORAL | 1 refills | Status: DC

## 2023-10-19 ENCOUNTER — Encounter: Payer: Medicaid Other | Admitting: Family Medicine

## 2023-11-05 ENCOUNTER — Encounter: Payer: Self-pay | Admitting: Family Medicine

## 2023-11-05 ENCOUNTER — Ambulatory Visit (INDEPENDENT_AMBULATORY_CARE_PROVIDER_SITE_OTHER): Payer: Medicaid Other | Admitting: Family Medicine

## 2023-11-05 VITALS — BP 103/74 | HR 86 | Ht 60.0 in | Wt 106.2 lb

## 2023-11-05 DIAGNOSIS — Z114 Encounter for screening for human immunodeficiency virus [HIV]: Secondary | ICD-10-CM | POA: Diagnosis not present

## 2023-11-05 DIAGNOSIS — Z1159 Encounter for screening for other viral diseases: Secondary | ICD-10-CM | POA: Diagnosis not present

## 2023-11-05 DIAGNOSIS — Z13220 Encounter for screening for lipoid disorders: Secondary | ICD-10-CM

## 2023-11-05 NOTE — Patient Instructions (Addendum)
 Good to see you today - Thank you for coming in  Things we discussed today:  Consider getting a Pap smear   Consider regular exercise for at least 20 minutes daily and eating a healthy varied diet   I will call you if your tests are not good.  Otherwise, I will send you a message on MyChart (if it is active) or a letter in the mail..  If you do not hear from me with in 2 weeks please call our office.      Be Healthy

## 2023-11-05 NOTE — Progress Notes (Signed)
    SUBJECTIVE:   CHIEF COMPLAINT / HPI:   Here for physical exam.  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  No murmurs, gallops or rubs.    Lungs:  Normal respiratory effort, chest expands symmetrically. Lungs are clear to auscultation, no crackles or wheezes. Abdomen: soft and non-tender without masses, organomegaly or hernias noted.  No guarding or rebound Neck:  No deformities, thyromegaly, masses, or tenderness noted.   Supple with full range of motion without pain. Extremities:  No cyanosis, edema, or deformity noted with good range of motion of all major joints.     ASSESSMENT/PLAN:   Healthy female   Screening cholesterol level -     Lipid panel  Screening for HIV (human immunodeficiency virus) -     HIV Antibody (routine testing w rflx)  Need for hepatitis C screening test -     Hepatitis C antibody    Annual Examination Female  under 24 yo  I reviewed the following patient responses on our Physical Exam Form Tobacco use  Alcohol Use  Weight  Exercise  Risk for STI  Increased family cancer risk Violence risk  PHQ9 score reviewed  Blood pressure reviewed  I considered the following items based upon USPSTF recommendations: HIV testing:  Hepatitis C testing Cholesterol screening STI screening if high risk (Hepatitis B, Syphilis, Gonorrhea, Chlamydia) Cervical Cancer if female at birth Immunizations - Influenza, Covid, [place], Pneumonia, Tetanus  See After Visit Summary for recommendations  Patient Instructions  Good to see you today - Thank you for coming in  Things we discussed  today:  Consider getting a Pap smear   Consider regular exercise for at least 20 minutes daily and eating a healthy varied diet   I will call you if your tests are not good.  Otherwise, I will send you a message on MyChart (if it is active) or a letter in the mail..  If you do not hear from me with in 2 weeks please call our office.      Be Healthy    Layman LITTIE Bee, MD [facility]

## 2023-11-09 LAB — LIPID PANEL
Chol/HDL Ratio: 2.8 {ratio} (ref 0.0–4.4)
Cholesterol, Total: 170 mg/dL (ref 100–199)
HDL: 61 mg/dL (ref >39)
LDL Chol Calc (NIH): 101 mg/dL — ABNORMAL HIGH (ref 0–99)
Triglycerides: 39 mg/dL (ref 0–149)
VLDL Cholesterol Cal: 8 mg/dL (ref 5–40)

## 2023-11-09 LAB — HEPATITIS C ANTIBODY: Hep C Virus Ab: NONREACTIVE

## 2023-11-09 LAB — HIV ANTIBODY (ROUTINE TESTING W REFLEX)

## 2023-11-23 ENCOUNTER — Ambulatory Visit: Payer: Medicaid Other

## 2023-11-23 ENCOUNTER — Ambulatory Visit
Admission: RE | Admit: 2023-11-23 | Discharge: 2023-11-23 | Disposition: A | Discharge status: Home / Self Care | Payer: Medicaid Other | Source: Ambulatory Visit | Attending: Family Medicine | Admitting: Family Medicine

## 2023-11-23 VITALS — BP 119/84 | HR 107 | Temp 98.6°F | Ht 60.0 in | Wt 103.0 lb

## 2023-11-23 DIAGNOSIS — R634 Abnormal weight loss: Secondary | ICD-10-CM

## 2023-11-23 DIAGNOSIS — R052 Subacute cough: Secondary | ICD-10-CM

## 2023-11-23 DIAGNOSIS — R059 Cough, unspecified: Secondary | ICD-10-CM | POA: Diagnosis not present

## 2023-11-23 MED ORDER — AZITHROMYCIN 500 MG PO TABS: 500.0000 mg | ORAL_TABLET | Freq: Every day | ORAL | 0 refills | Status: AC

## 2023-11-23 NOTE — Progress Notes (Signed)
    SUBJECTIVE:   CHIEF COMPLAINT / HPI:   Cough Started 4 weeks ago along with sudden fatigue, fever, hoarseness. At this point cough was dry Around 2 weeks ago, cough became productive but she has been feeling better in general. Notes rhinorrhea, hoarseness over past week. 3 days ago had fever 100.5 and fatigue returned.  No diarrhea, vomiting, rash. No one with similar symptoms.  Came back from [place] about a month ago.  Has lost ~ 15 lbs in last 3 months without trying but does note decreased appetite when she is feeling very fatigued.  PERTINENT  PMH / PSH: None pertinent  OBJECTIVE:   BP 119/84   Pulse (!) 107   Temp 98.6 F (37 C) (Oral)   Ht 5' (1.524 m)   Wt 103 lb (46.7 kg)   SpO2 100%   BMI 20.12 kg/m    General: NAD, pleasant, able to participate in exam HEENT: White sclera, clear conjunctiva, MMM, no erythema or exudate of oropharynx Lymph nodes: No cervical, submandibular, clavicular, axillar, or inguinal lymphadenopathy Cardiac: Tachycardic, regular rhythm, no murmurs. Respiratory: CTAB, normal effort, No wheezes, rales or rhonchi.  Persistent dry cough throughout exam. Abdomen: Bowel sounds present, nontender, nondistended, soft Skin: warm and dry Neuro: alert, no obvious focal deficits Psych: Normal affect and mood  ASSESSMENT/PLAN:   Subacute cough Now on week 4 of cough associated with recurrent fever and fatigue.  Differentials include: lobar pneumonia (less likely, no focal lung sounds) malignancy (unlikely given age, weight loss more likely associated with decreased appetite in setting of illness.  Will check CBC with diff and ESR) HIV (considered in setting of weight loss and recurrent fever, was HIV negative earlier this month ) atypical pneumonia or pertussis (both possible given chronicity)  Tachycardia likely d/t persistent coughing during exam and illness, will check CBC to rule out anemia and look for signs of infection.  -CBC with  differential -ESR -CXR -Pertussis PCR -Will treat empirically with azithromycin 500 mg for 3 days -Return precautions discussed       Dr. Erick Alley, DO [facility]

## 2023-11-23 NOTE — Patient Instructions (Signed)
It was great to see you! Thank you for allowing me to participate in your care!  Our plans for today:  - Get chest x ray at [facility] - antibiotic (sent to pharmacy) - Return if no improvement in the next week  We are checking some labs today, I will call you if they are abnormal will send you a MyChart message or a letter if they are normal.  If you do not hear about your labs in the next 2 weeks please let us know.  Take care and seek immediate care sooner if you develop any concerns.   Dr. Erick Alley, DO [facility]

## 2023-11-24 LAB — CBC WITH DIFFERENTIAL/PLATELET
Basophils Absolute: 0 10*3/uL (ref 0.0–0.2)
Basos: 1 %
EOS (ABSOLUTE): 0.1 10*3/uL (ref 0.0–0.4)
Eos: 2 %
Hematocrit: 45.2 % (ref 34.0–46.6)
Hemoglobin: 14.7 g/dL (ref 11.1–15.9)
Immature Grans (Abs): 0 10*3/uL (ref 0.0–0.1)
Immature Granulocytes: 0 %
Lymphocytes Absolute: 0.8 10*3/uL (ref 0.7–3.1)
Lymphs: 21 %
MCH: 29.4 pg (ref 26.6–33.0)
MCHC: 32.5 g/dL (ref 31.5–35.7)
MCV: 90 fL (ref 79–97)
Monocytes Absolute: 0.5 10*3/uL (ref 0.1–0.9)
Monocytes: 11 %
Neutrophils Absolute: 2.6 10*3/uL (ref 1.4–7.0)
Neutrophils: 65 %
Platelets: 296 10*3/uL (ref 150–450)
RBC: 5 x10E6/uL (ref 3.77–5.28)
RDW: 12 % (ref 11.7–15.4)
WBC: 4.1 10*3/uL (ref 3.4–10.8)

## 2023-11-24 LAB — SEDIMENTATION RATE: Sed Rate: 14 mm/h (ref 0–32)

## 2023-11-25 ENCOUNTER — Encounter: Payer: Self-pay | Admitting: Student

## 2023-11-25 DIAGNOSIS — R052 Subacute cough: Secondary | ICD-10-CM | POA: Insufficient documentation

## 2023-11-25 LAB — BORDETELLA PERTUSSIS PCR
B. parapertussis DNA: NEGATIVE
B. pertussis DNA: NEGATIVE

## 2023-11-25 NOTE — Assessment & Plan Note (Addendum)
Now on week 4 of cough associated with recurrent fever and fatigue.  Differentials include: lobar pneumonia (less likely, no focal lung sounds) malignancy (unlikely given age, weight loss more likely associated with decreased appetite in setting of illness.  Will check CBC with diff and ESR) HIV (considered in setting of weight loss and recurrent fever, was HIV negative earlier this month ) atypical pneumonia or pertussis (both possible given chronicity)  Tachycardia likely d/t persistent coughing during exam and illness, will check CBC to rule out anemia and look for signs of infection.  -CBC with differential -ESR -CXR -Pertussis PCR -Will treat empirically with azithromycin 500 mg for 3 days -Return precautions discussed

## 2024-01-10 ENCOUNTER — Emergency Department (HOSPITAL_COMMUNITY)

## 2024-01-10 ENCOUNTER — Emergency Department (HOSPITAL_COMMUNITY)
Admission: EM | Admit: 2024-01-10 | Discharge: 2024-01-10 | Disposition: A | Discharge status: Home / Self Care | Attending: Emergency Medicine | Admitting: Emergency Medicine

## 2024-01-10 ENCOUNTER — Encounter (HOSPITAL_COMMUNITY): Payer: Self-pay | Admitting: Emergency Medicine

## 2024-01-10 DIAGNOSIS — R1031 Right lower quadrant pain: Secondary | ICD-10-CM | POA: Diagnosis not present

## 2024-01-10 DIAGNOSIS — R109 Unspecified abdominal pain: Secondary | ICD-10-CM | POA: Diagnosis not present

## 2024-01-10 DIAGNOSIS — R112 Nausea with vomiting, unspecified: Secondary | ICD-10-CM | POA: Insufficient documentation

## 2024-01-10 DIAGNOSIS — N200 Calculus of kidney: Secondary | ICD-10-CM

## 2024-01-10 DIAGNOSIS — N202 Calculus of kidney with calculus of ureter: Secondary | ICD-10-CM | POA: Diagnosis not present

## 2024-01-10 LAB — URINALYSIS, ROUTINE W REFLEX MICROSCOPIC
Bilirubin Urine: NEGATIVE
Glucose, UA: NEGATIVE mg/dL
Hgb urine dipstick: NEGATIVE
Ketones, ur: 80 mg/dL — AB
Leukocytes,Ua: NEGATIVE
Nitrite: NEGATIVE
Protein, ur: 30 mg/dL — AB
Specific Gravity, Urine: 1.02 (ref 1.005–1.030)
pH: 8 (ref 5.0–8.0)

## 2024-01-10 LAB — LIPASE, BLOOD: Lipase: 26 U/L (ref 11–51)

## 2024-01-10 LAB — CBC
HCT: 38.4 % (ref 36.0–46.0)
Hemoglobin: 12.7 g/dL (ref 12.0–15.0)
MCH: 29.7 pg (ref 26.0–34.0)
MCHC: 33.1 g/dL (ref 30.0–36.0)
MCV: 89.9 fL (ref 80.0–100.0)
Platelets: 287 10*3/uL (ref 150–400)
RBC: 4.27 MIL/uL (ref 3.87–5.11)
RDW: 13.4 % (ref 11.5–15.5)
WBC: 4.9 10*3/uL (ref 4.0–10.5)
nRBC: 0 % (ref 0.0–0.2)

## 2024-01-10 LAB — COMPREHENSIVE METABOLIC PANEL
ALT: 16 U/L (ref 0–44)
AST: 26 U/L (ref 15–41)
Albumin: 3.9 g/dL (ref 3.5–5.0)
Alkaline Phosphatase: 53 U/L (ref 38–126)
Anion gap: 11 (ref 5–15)
BUN: 14 mg/dL (ref 6–20)
CO2: 23 mmol/L (ref 22–32)
Calcium: 9 mg/dL (ref 8.9–10.3)
Chloride: 105 mmol/L (ref 98–111)
Creatinine, Ser: 0.8 mg/dL (ref 0.44–1.00)
GFR, Estimated: >60 mL/min (ref >60)
Glucose, Bld: 102 mg/dL — ABNORMAL HIGH (ref 70–99)
Potassium: 3.8 mmol/L (ref 3.5–5.1)
Sodium: 139 mmol/L (ref 135–145)
Total Bilirubin: 0.9 mg/dL (ref 0.0–1.2)
Total Protein: 7 g/dL (ref 6.5–8.1)

## 2024-01-10 LAB — HCG, SERUM, QUALITATIVE: Preg, Serum: NEGATIVE

## 2024-01-10 MED ORDER — HYDROCODONE-ACETAMINOPHEN 5-325 MG PO TABS
1.0000 | ORAL_TABLET | Freq: Once | ORAL | Status: AC
  Administered 2024-01-10: 1 via ORAL
  Filled 2024-01-10: qty 1

## 2024-01-10 MED ORDER — HYDROCODONE-ACETAMINOPHEN 7.5-325 MG/15ML PO SOLN: 10.0000 mL | Freq: Once | ORAL | Status: DC

## 2024-01-10 MED ORDER — ONDANSETRON 4 MG PO TBDP
4.0000 mg | ORAL_TABLET | Freq: Once | ORAL | Status: AC
  Administered 2024-01-10: 4 mg via ORAL
  Filled 2024-01-10: qty 1

## 2024-01-10 MED ORDER — ONDANSETRON 4 MG PO TBDP: ORAL_TABLET | ORAL | 0 refills | Status: AC

## 2024-01-10 MED ORDER — CEPHALEXIN 500 MG PO CAPS: 500.0000 mg | ORAL_CAPSULE | Freq: Two times a day (BID) | ORAL | 0 refills | Status: AC

## 2024-01-10 NOTE — Discharge Instructions (Addendum)
 Follow-up urine culture result, if abnormal you will need to start Keflex with guidance from a clinician. Use Tylenol every 4 hours and ibuprofen every 6 hours needed for pain.  Use Zofran every 6 hours needed for nausea or vomiting.  Follow-up with urology if persistent signs or symptoms or concerns.

## 2024-01-10 NOTE — ED Provider Notes (Signed)
 [place] EMERGENCY DEPARTMENT AT [facility] Provider Note   CSN: [id number] Arrival date & time: 01/10/24  2952     History  Chief Complaint  Patient presents with   Flank Pain   Nausea   Polyuria    Brenda Gordon is a 29 y.o. female.  HPI 28 year old female previously healthy denies pregnancy, normal menstrual cycles presents today complaining of right flank pain that began around 530 this morning.  She states that she began having right flank pain that radiated to the front.  She describes nausea, vomiting, and chills that occur with this.  She reports some increased frequency of urination but denies any pain with urination.  She denies any similar symptoms in the past.  She was given pain medicine at triage and states pain has decreased with the pain medicine.  Was initially 8/10 describes it as essentially not being present at this time.  She denies any similar symptoms in the past.  She has not had a definite fever.    Home Medications Prior to Admission medications   Not on File      Allergies    Patient has no known allergies.    Review of Systems   Review of Systems  Physical Exam Updated Vital Signs BP 108/64   Pulse 80   Temp 97.8 F (36.6 C)   Resp 17   Ht 1.524 m (5')   Wt 45.4 kg   LMP 01/01/2024   SpO2 99%   BMI 19.53 kg/m  Physical Exam Vitals reviewed.  HENT:     Head: Normocephalic.     Right Ear: External ear normal.     Left Ear: External ear normal.     Nose: Nose normal.  Eyes:     Pupils: Pupils are equal, round, and reactive to light.  Cardiovascular:     Rate and Rhythm: Normal rate and regular rhythm.     Pulses: Normal pulses.  Pulmonary:     Effort: Pulmonary effort is normal.  Abdominal:     General: Abdomen is flat.     Palpations: Abdomen is soft.     Tenderness: There is no abdominal tenderness.  Musculoskeletal:        General: Normal range of motion.     Cervical back: Normal range of motion.  Skin:     General: Skin is warm and dry.     Capillary Refill: Capillary refill takes less than 2 seconds.  Neurological:     General: No focal deficit present.     Mental Status: She is alert.  Psychiatric:        Mood and Affect: Mood normal.     ED Results / Procedures / Treatments   Labs (all labs ordered are listed, but only abnormal results are displayed) Labs Reviewed  COMPREHENSIVE METABOLIC PANEL - Abnormal; Notable for the following components:      Result Value   Glucose, Bld 102 (*)    All other components within normal limits  URINALYSIS, ROUTINE W REFLEX MICROSCOPIC - Abnormal; Notable for the following components:   APPearance CLOUDY (*)    Ketones, ur 80 (*)    Protein, ur 30 (*)    Bacteria, UA FEW (*)    All other components within normal limits  LIPASE, BLOOD  CBC  HCG, SERUM, QUALITATIVE    EKG None  Radiology No results found.  Procedures Procedures    Medications Ordered in ED Medications  ondansetron (ZOFRAN-ODT) disintegrating tablet 4 mg (  4 mg Oral Given 01/10/24 0754)  HYDROcodone-acetaminophen (NORCO/VICODIN) 5-325 MG per tablet 1 tablet (1 tablet Oral Given 01/10/24 0754)    ED Course/ Medical Decision Making/ A&P                                 Medical Decision Making Amount and/or Complexity of Data Reviewed Labs: ordered. Radiology: ordered.  Risk Prescription drug management.  28 year old female presents today complaining of right flank pain that radiates to right lower quadrant. Patient has had some UTI symptoms.  She does not have any abdominal pain Differential diagnosis includes but is not limited to biliary colic, UTI, renal colic, ectopic pregnancy, ovarian torsion, Patient with negative pregnancy test-no concern for ectopic pregnancy Urinalysis is significant for 0-5 red blood cells, 0-5 white blood cells, few bacteria and squamous epithelial cells which are present.  Doubt [place] or acute UTI Patient's abdomen is nontender-doubt  biliary colic or appendicitis Patient is not sexually active-low index of suspicion for pelvic infection. Patient is pain-free here after medications.  CT of abdomen to evaluate for renal stones pending.  Care discussed with Dr. Jodi Mourning who will dispo as appropriate.        Final Clinical Impression(s) / ED Diagnoses Final diagnoses:  Flank pain    Rx / DC Orders ED Discharge Orders     None         Margarita Grizzle, MD 01/10/24 1740

## 2024-01-10 NOTE — ED Triage Notes (Addendum)
 Pt. Stated, when I got up around 530 and started having right flank pain that radiates to the front. Im also having nausea. I have the urge to pee but only a little comes out.

## 2024-01-10 NOTE — ED Provider Notes (Signed)
 Patient care signed out to follow-up CT scan result.  CT scan result reviewed punctate kidney stone on the left.  Patient well-appearing, tolerating oral fluids.  Discussed follow-up urine culture result, Keflex and Zofran prescription given for home.  Discussed follow-up with urology as needed.  Kenton Kingfisher, MD 01/10/24 [phone]

## 2024-01-11 LAB — URINE CULTURE: Culture: NO GROWTH

## 2024-06-15 ENCOUNTER — Ambulatory Visit

## 2024-12-07 ENCOUNTER — Encounter

## 2024-12-13 ENCOUNTER — Encounter
